# Patient Record
Sex: Female | Born: 1970 | Hispanic: No | Smoking: Current some day smoker
Health system: Southern US, Community
[De-identification: ages and names within clinical notes are randomized; demographics above are authoritative.]

## PROBLEM LIST (undated history)

## (undated) DIAGNOSIS — I10 Essential (primary) hypertension: Secondary | ICD-10-CM

## (undated) DIAGNOSIS — E119 Type 2 diabetes mellitus without complications: Secondary | ICD-10-CM

## (undated) DIAGNOSIS — E7800 Pure hypercholesterolemia, unspecified: Secondary | ICD-10-CM

## (undated) DIAGNOSIS — F329 Major depressive disorder, single episode, unspecified: Secondary | ICD-10-CM

## (undated) DIAGNOSIS — F32A Depression, unspecified: Secondary | ICD-10-CM

## (undated) DIAGNOSIS — F419 Anxiety disorder, unspecified: Secondary | ICD-10-CM

## (undated) HISTORY — PX: ABDOMINAL HYSTERECTOMY: SHX81

---

## 2013-04-12 ENCOUNTER — Ambulatory Visit (INDEPENDENT_AMBULATORY_CARE_PROVIDER_SITE_OTHER): Payer: Self-pay | Admitting: Nurse Practitioner

## 2013-04-12 ENCOUNTER — Other Ambulatory Visit: Payer: Self-pay | Admitting: *Deleted

## 2013-04-12 ENCOUNTER — Encounter: Payer: Self-pay | Admitting: Nurse Practitioner

## 2013-04-12 VITALS — BP 154/92 | HR 66 | Temp 97.7°F

## 2013-04-12 DIAGNOSIS — R4789 Other speech disturbances: Secondary | ICD-10-CM

## 2013-04-12 DIAGNOSIS — R479 Unspecified speech disturbances: Secondary | ICD-10-CM

## 2013-04-12 DIAGNOSIS — R079 Chest pain, unspecified: Secondary | ICD-10-CM

## 2013-04-12 NOTE — Progress Notes (Addendum)
  Subjective:    Patient ID: Samantha Shelton, female    DOB: [birth year], 42 y.o.   MRN: [id number]  HPI This pateint was brought in by mom having never been seen here- They were on their way home this morning when patient started having difficulty speaking an dc/o SOB and chesyt pain- By the time she arrived here she was no longer having chest pain but speech very slurred- Haong trouble getting thoughts to come out verbally- Denies any weakness on either side- She is a diabetic but was unable to give [place] a list of meds and mom did not know what she was taking either    Review of Systems  Constitutional: Negative for fever, chills, appetite change and fatigue.  HENT: Negative.   Respiratory: Positive for chest tightness and shortness of breath. Negative for cough and choking.   Cardiovascular: Positive for chest pain. Negative for palpitations and leg swelling.  Gastrointestinal: Negative.   Genitourinary: Negative.   Musculoskeletal: Negative.   Neurological: Positive for speech difficulty. Negative for dizziness, tremors, syncope, facial asymmetry, weakness, light-headedness, numbness and headaches.       Objective:   Physical Exam  Constitutional: She is oriented to person, place, and time. She appears well-developed and well-nourished.  Eyes: Conjunctivae and EOM are normal. Pupils are equal, round, and reactive to light.  Neck: Normal range of motion. Neck supple.  Cardiovascular: Normal rate, regular rhythm and normal heart sounds.   Pulmonary/Chest: Effort normal and breath sounds normal.  Musculoskeletal: Normal range of motion.  No weakness noted in any extremities  Neurological: She is alert and oriented to person, place, and time. She has normal reflexes. No cranial nerve deficit.  Psychiatric: She has a normal mood and affect. Her behavior is normal. Judgment and thought content normal.  Speech very slurred- trouble getting words to come out.   Blood sugar 124 IV NACL- KVO-  Left antecubital 20g O2 via cannulla  EKG- NSR     Assessment & Plan:   1. Speech abnormality   2. Chest pain    [facility]- [facility]- (EMS argued with patient as to where to go- [facility] wanted to go to [place] and they wanted to take her to [facility])  Report given to EMS Mary-Margaret Daphine Deutscher, FNP

## 2013-04-12 NOTE — Addendum Note (Signed)
Addended by: Monica Becton on: 04/12/2013 05:28 PM   Modules accepted: Orders

## 2013-05-06 ENCOUNTER — Encounter (HOSPITAL_COMMUNITY): Payer: Self-pay | Admitting: Emergency Medicine

## 2013-05-06 ENCOUNTER — Emergency Department (HOSPITAL_COMMUNITY)
Admission: EM | Admit: 2013-05-06 | Discharge: 2013-05-07 | Disposition: A | Discharge status: Home / Self Care | Payer: No Typology Code available for payment source | Attending: Emergency Medicine | Admitting: Emergency Medicine

## 2013-05-06 DIAGNOSIS — S0990XA Unspecified injury of head, initial encounter: Secondary | ICD-10-CM | POA: Insufficient documentation

## 2013-05-06 DIAGNOSIS — Y92410 Unspecified street and highway as the place of occurrence of the external cause: Secondary | ICD-10-CM | POA: Insufficient documentation

## 2013-05-06 DIAGNOSIS — S298XXA Other specified injuries of thorax, initial encounter: Secondary | ICD-10-CM | POA: Insufficient documentation

## 2013-05-06 DIAGNOSIS — IMO0002 Reserved for concepts with insufficient information to code with codable children: Secondary | ICD-10-CM | POA: Insufficient documentation

## 2013-05-06 DIAGNOSIS — E119 Type 2 diabetes mellitus without complications: Secondary | ICD-10-CM

## 2013-05-06 DIAGNOSIS — M25551 Pain in right hip: Secondary | ICD-10-CM

## 2013-05-06 DIAGNOSIS — M79601 Pain in right arm: Secondary | ICD-10-CM

## 2013-05-06 DIAGNOSIS — S79919A Unspecified injury of unspecified hip, initial encounter: Secondary | ICD-10-CM | POA: Insufficient documentation

## 2013-05-06 DIAGNOSIS — S46909A Unspecified injury of unspecified muscle, fascia and tendon at shoulder and upper arm level, unspecified arm, initial encounter: Secondary | ICD-10-CM | POA: Insufficient documentation

## 2013-05-06 DIAGNOSIS — R079 Chest pain, unspecified: Secondary | ICD-10-CM

## 2013-05-06 DIAGNOSIS — Z794 Long term (current) use of insulin: Secondary | ICD-10-CM | POA: Insufficient documentation

## 2013-05-06 DIAGNOSIS — Z79899 Other long term (current) drug therapy: Secondary | ICD-10-CM | POA: Insufficient documentation

## 2013-05-06 DIAGNOSIS — E1169 Type 2 diabetes mellitus with other specified complication: Secondary | ICD-10-CM | POA: Insufficient documentation

## 2013-05-06 DIAGNOSIS — S4980XA Other specified injuries of shoulder and upper arm, unspecified arm, initial encounter: Secondary | ICD-10-CM | POA: Insufficient documentation

## 2013-05-06 DIAGNOSIS — S3981XA Other specified injuries of abdomen, initial encounter: Secondary | ICD-10-CM | POA: Insufficient documentation

## 2013-05-06 DIAGNOSIS — M542 Cervicalgia: Secondary | ICD-10-CM

## 2013-05-06 DIAGNOSIS — E162 Hypoglycemia, unspecified: Secondary | ICD-10-CM

## 2013-05-06 DIAGNOSIS — Y9389 Activity, other specified: Secondary | ICD-10-CM | POA: Insufficient documentation

## 2013-05-06 LAB — GLUCOSE, CAPILLARY: Glucose-Capillary: 66 mg/dL — ABNORMAL LOW (ref 70–99)

## 2013-05-06 MED ORDER — SODIUM CHLORIDE 0.9 % IV BOLUS (SEPSIS)
1000.0000 mL | Freq: Once | INTRAVENOUS | Status: AC
  Administered 2013-05-07: 1000 mL via INTRAVENOUS

## 2013-05-06 MED ORDER — KETOROLAC TROMETHAMINE 30 MG/ML IJ SOLN
30.0000 mg | Freq: Once | INTRAMUSCULAR | Status: AC
  Administered 2013-05-07: 30 mg via INTRAVENOUS
  Filled 2013-05-06: qty 1
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
## 2013-05-06 NOTE — ED Notes (Signed)
CBG checked 66

## 2013-05-07 ENCOUNTER — Emergency Department (HOSPITAL_COMMUNITY): Payer: No Typology Code available for payment source

## 2013-05-07 ENCOUNTER — Encounter (HOSPITAL_COMMUNITY): Payer: Self-pay | Admitting: Radiology

## 2013-05-07 LAB — CBC
Hemoglobin: 13.4 g/dL (ref 12.0–15.0)
MCH: 30.6 pg (ref 26.0–34.0)
Platelets: 271 10*3/uL (ref 150–400)
RBC: 4.38 MIL/uL (ref 3.87–5.11)

## 2013-05-07 LAB — BASIC METABOLIC PANEL
Calcium: 9.4 mg/dL (ref 8.4–10.5)
Chloride: 101 mEq/L (ref 96–112)
GFR calc Af Amer: >90 mL/min (ref >90)
GFR calc non Af Amer: >90 mL/min (ref >90)
Glucose, Bld: 70 mg/dL (ref 70–99)
Sodium: 139 mEq/L (ref 135–145)

## 2013-05-07 LAB — GLUCOSE, CAPILLARY: Glucose-Capillary: 195 mg/dL — ABNORMAL HIGH (ref 70–99)

## 2013-05-07 MED ORDER — CYCLOBENZAPRINE HCL 10 MG PO TABS: 10.0000 mg | ORAL_TABLET | Freq: Two times a day (BID) | ORAL | Status: AC | PRN

## 2013-05-07 MED ORDER — IOHEXOL 300 MG/ML  SOLN
80.0000 mL | Freq: Once | INTRAMUSCULAR | Status: AC | PRN
  Administered 2013-05-07: 80 mL via INTRAVENOUS

## 2013-05-07 MED ORDER — DEXTROSE 50 % IV SOLN
50.0000 mL | INTRAVENOUS | Status: AC
  Administered 2013-05-07: 50 mL via INTRAVENOUS
  Filled 2013-05-07: qty 50

## 2013-05-07 MED ORDER — IBUPROFEN 600 MG PO TABS: 600.0000 mg | ORAL_TABLET | Freq: Four times a day (QID) | ORAL | Status: AC | PRN

## 2013-05-07 MED ORDER — TRAMADOL HCL 50 MG PO TABS: 50.0000 mg | ORAL_TABLET | Freq: Four times a day (QID) | ORAL | Status: AC | PRN
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
## 2013-05-07 NOTE — ED Notes (Signed)
Patient transported to CT 

## 2013-05-07 NOTE — ED Notes (Addendum)
Pt given 1/4 cup of water and green swab to relieve dry mouth. Per RN request
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
## 2016-05-23 ENCOUNTER — Encounter (HOSPITAL_COMMUNITY): Payer: Self-pay | Admitting: *Deleted

## 2016-05-23 ENCOUNTER — Emergency Department (HOSPITAL_COMMUNITY)
Admission: EM | Admit: 2016-05-23 | Discharge: 2016-05-23 | Disposition: A | Discharge status: Home / Self Care | Payer: Medicaid Other | Attending: Emergency Medicine | Admitting: Emergency Medicine

## 2016-05-23 ENCOUNTER — Emergency Department (HOSPITAL_COMMUNITY): Payer: Medicaid Other

## 2016-05-23 DIAGNOSIS — Z794 Long term (current) use of insulin: Secondary | ICD-10-CM | POA: Insufficient documentation

## 2016-05-23 DIAGNOSIS — R002 Palpitations: Secondary | ICD-10-CM | POA: Insufficient documentation

## 2016-05-23 DIAGNOSIS — E119 Type 2 diabetes mellitus without complications: Secondary | ICD-10-CM | POA: Diagnosis not present

## 2016-05-23 DIAGNOSIS — Z79899 Other long term (current) drug therapy: Secondary | ICD-10-CM | POA: Diagnosis not present

## 2016-05-23 DIAGNOSIS — I10 Essential (primary) hypertension: Secondary | ICD-10-CM | POA: Diagnosis not present

## 2016-05-23 DIAGNOSIS — F17200 Nicotine dependence, unspecified, uncomplicated: Secondary | ICD-10-CM | POA: Diagnosis not present

## 2016-05-23 DIAGNOSIS — Z91040 Latex allergy status: Secondary | ICD-10-CM | POA: Insufficient documentation

## 2016-05-23 HISTORY — DX: Anxiety disorder, unspecified: F41.9

## 2016-05-23 HISTORY — DX: Depression, unspecified: F32.A

## 2016-05-23 HISTORY — DX: Pure hypercholesterolemia, unspecified: E78.00

## 2016-05-23 HISTORY — DX: Major depressive disorder, single episode, unspecified: F32.9

## 2016-05-23 HISTORY — DX: Type 2 diabetes mellitus without complications: E11.9

## 2016-05-23 HISTORY — DX: Essential (primary) hypertension: I10

## 2016-05-23 LAB — BASIC METABOLIC PANEL
Anion gap: 12 (ref 5–15)
BUN: 9 mg/dL (ref 6–20)
CO2: 25 mmol/L (ref 22–32)
Calcium: 10.1 mg/dL (ref 8.9–10.3)
Chloride: 99 mmol/L — ABNORMAL LOW (ref 101–111)
Creatinine, Ser: 0.83 mg/dL (ref 0.44–1.00)
GFR calc Af Amer: >60 mL/min (ref >60)
GFR calc non Af Amer: >60 mL/min (ref >60)
Glucose, Bld: 274 mg/dL — ABNORMAL HIGH (ref 65–99)
Potassium: 3.4 mmol/L — ABNORMAL LOW (ref 3.5–5.1)
Sodium: 136 mmol/L (ref 135–145)

## 2016-05-23 LAB — CBC
HEMATOCRIT: 41.4 % (ref 36.0–46.0)
HEMOGLOBIN: 14.2 g/dL (ref 12.0–15.0)
MCH: 31.6 pg (ref 26.0–34.0)
MCHC: 34.3 g/dL (ref 30.0–36.0)
MCV: 92.2 fL (ref 78.0–100.0)
Platelets: 223 10*3/uL (ref 150–400)
RBC: 4.49 MIL/uL (ref 3.87–5.11)
RDW: 12.9 % (ref 11.5–15.5)
WBC: 4.8 10*3/uL (ref 4.0–10.5)

## 2016-05-23 LAB — I-STAT TROPONIN, ED: Troponin i, poc: 0 ng/mL (ref 0.00–0.08)

## 2016-05-23 MED ORDER — CLONIDINE HCL 0.1 MG PO TABS
0.1000 mg | ORAL_TABLET | Freq: Once | ORAL | Status: AC
  Administered 2016-05-23: 0.1 mg via ORAL
  Filled 2016-05-23: qty 1

## 2016-05-23 NOTE — ED Triage Notes (Signed)
EMS gave her 4 Baby ASA and one Nitro, and she felt better. Pt. Has a 20 g in left ac, .

## 2016-05-23 NOTE — ED Notes (Signed)
Pt reports having sharp headache on posterior head that began 5 mins ago; pt denies vision changes, nausea, or photophobia

## 2016-05-23 NOTE — ED Notes (Signed)
Pt was walking in the waiting room. Pt was about to change into gown without difficulty. Pt has no pain right now. Pt states that she did have an episode in the lobby about 30 mins ago where she felt her heart racing again.  Pt states she started a new medication yesterday (believed to be Lasix). Pt only ate two boiled eggs and bacon for breakfast and yogurt for lunch around 1330.
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
## 2016-05-23 NOTE — ED Provider Notes (Signed)
[facility]-EMERGENCY DEPT Provider Note   CSN: [id number] Arrival date & time: 05/23/16  1545     History   Chief Complaint Chief Complaint  Patient presents with  . Palpitations    HPI Richmond CampbellKimberly F Rocchio is a 45 y.o. female.  The history is provided by the patient.  Palpitations   This is a recurrent problem. The current episode started 1 to 2 hours ago. The problem occurs constantly. The problem has been resolved. The problem is associated with fear and anxiety (nosebleed that made her feel lightheaded). Pertinent negatives include no diaphoresis, no fever, no syncope, no abdominal pain, no cough and no shortness of breath. She has tried nothing for the symptoms.    Past Medical History:  Diagnosis Date  . Anxiety   . Depression   . Diabetes mellitus without complication (HCC)   . Hypercholesterolemia   . Hypertension     There are no active problems to display for this patient.   Past Surgical History:  Procedure Laterality Date  . ABDOMINAL HYSTERECTOMY      OB History    No data available       Home Medications    Prior to Admission medications   Medication Sig Start Date End Date Taking? Authorizing Provider  cyclobenzaprine (FLEXERIL) 10 MG tablet Take 1 tablet (10 mg total) by mouth 2 (two) times daily as needed for muscle spasms. 05/07/13   Brandt LoosenJulie Manly, MD  gabapentin (NEURONTIN) 300 MG capsule Take 300 mg by mouth 2 (two) times daily.    Historical Provider, MD  ibuprofen (ADVIL,MOTRIN) 600 MG tablet Take 1 tablet (600 mg total) by mouth every 6 (six) hours as needed for pain. 05/07/13   Brandt LoosenJulie Manly, MD  insulin aspart (NOVOLOG) 100 UNIT/ML injection Inject 4 Units into the skin 3 (three) times daily with meals.    Historical Provider, MD  insulin glargine (LANTUS) 100 UNIT/ML injection Inject into the skin at bedtime. 20-30 u daily    Historical Provider, MD  lisinopril (PRINIVIL,ZESTRIL) 10 MG tablet Take 10 mg by mouth daily.    Historical Provider, MD    traMADol (ULTRAM) 50 MG tablet Take 1 tablet (50 mg total) by mouth every 6 (six) hours as needed for pain. 05/07/13   Brandt LoosenJulie Manly, MD    Family History No family history on file.  Social History Social History  Substance Use Topics  . Smoking status: Current Some Day Smoker  . Smokeless tobacco: Never Used  . Alcohol use Yes     Allergies   Celexa [citalopram] and Latex   Review of Systems Review of Systems  Constitutional: Negative for diaphoresis and fever.  Respiratory: Negative for cough and shortness of breath.   Cardiovascular: Positive for palpitations. Negative for syncope.  Gastrointestinal: Negative for abdominal pain.  All other systems reviewed and are negative.    Physical Exam Updated Vital Signs BP 175/86   Pulse (!) 58   Temp 98.4 F (36.9 C) (Oral)   Resp 16   Wt 148 lb (67.1 kg)   SpO2 100%   Physical Exam  Constitutional: She is oriented to person, place, and time. She appears well-developed and well-nourished. No distress.  HENT:  Head: Normocephalic.  Nose: Nose normal.  Eyes: Conjunctivae are normal.  Neck: Neck supple. No tracheal deviation present.  Cardiovascular: Normal rate, regular rhythm and normal heart sounds.  Exam reveals no gallop and no friction rub.   No murmur heard. Pulmonary/Chest: Effort normal and breath sounds normal. No  respiratory distress. She has no wheezes. She has no rales.  Abdominal: Soft. She exhibits no distension. There is no tenderness.  Neurological: She is alert and oriented to person, place, and time.  Skin: Skin is warm and dry. Capillary refill takes less than 2 seconds.  Psychiatric: She has a normal mood and affect.     ED Treatments / Results  Labs (all labs ordered are listed, but only abnormal results are displayed) Labs Reviewed  BASIC METABOLIC PANEL - Abnormal; Notable for the following:       Result Value   Potassium 3.4 (*)    Chloride 99 (*)    Glucose, Bld 274 (*)    All other  components within normal limits  CBC  I-STAT TROPOININ, ED    EKG  EKG Interpretation  Date/Time:  Saturday May 23 2016 15:48:55 EDT Ventricular Rate:  62 PR Interval:  136 QRS Duration: 104 QT Interval:  424 QTC Calculation: 430 R Axis:   10 Text Interpretation:  Normal sinus rhythm Benign early repolarization No significant change since last tracing Confirmed by Nishita Isaacks MD, Raeanne Deschler [phone]) on 05/23/2016 6:43:35 PM       Radiology Dg Chest 2 View  Result Date: 05/23/2016 CLINICAL DATA:  Chest pain and palpitations.  Tachycardia. EXAM: CHEST  2 VIEW COMPARISON:  05/07/2013 FINDINGS: The heart size and mediastinal contours are within normal limits. Both lungs are clear. The visualized skeletal structures are unremarkable. IMPRESSION: Normal exam. Electronically Signed   By: Francene BoyersJames  Maxwell M.D.   On: 05/23/2016 17:28    Procedures Procedures (including critical care time)  Medications Ordered in ED Medications  cloNIDine (CATAPRES) tablet 0.1 mg (0.1 mg Oral Given 05/23/16 1936)     Initial Impression / Assessment and Plan / ED Course  I have reviewed the triage vital signs and the nursing notes.  Pertinent labs & imaging results that were available during my care of the patient were reviewed by me and considered in my medical decision making (see chart for details).  Clinical Course    45 y.o. female presents with having nosebleed and feeling briefly lightheaded with dizziness. Recently started on HCTZ. She is concerned having palpitations and anxiety. EKG interpreted by me without ST or T wave changes concerning for myocardial ischemia. No delta wave, no prolonged QTc, no brugada to suggest arrhythmogenicity. Provided clonidine to help with anxiety and BP but I explained this is an outpatient issue that would need follow up and titration of medicines. Plan to follow up with PCP as needed and return precautions discussed for worsening or new concerning symptoms.   Final  Clinical Impressions(s) / ED Diagnoses   Final diagnoses:  Palpitations    New Prescriptions New Prescriptions   No medications on file     Lyndal Pulleyaniel Raider Valbuena, MD 05/24/16 0028

## 2016-05-23 NOTE — ED Triage Notes (Signed)
Patient states she was started on a new medication for her bp on yesterday  She thinks it was hydrochlorothiazide.  Patient states she blew her nose had a nose bleed and then had onset of feeling dizziness.  Patient also diabetic.  cbg was elevated today.   She is alert.

## 2018-05-01 IMAGING — CR DG CHEST 2V
2 series · 2 of 2 positions shown · non-contrast
Comparison: 05/07/2013

CLINICAL DATA: Chest pain and palpitations.  Tachycardia.

EXAM:
CHEST  2 VIEW

[chest lat]
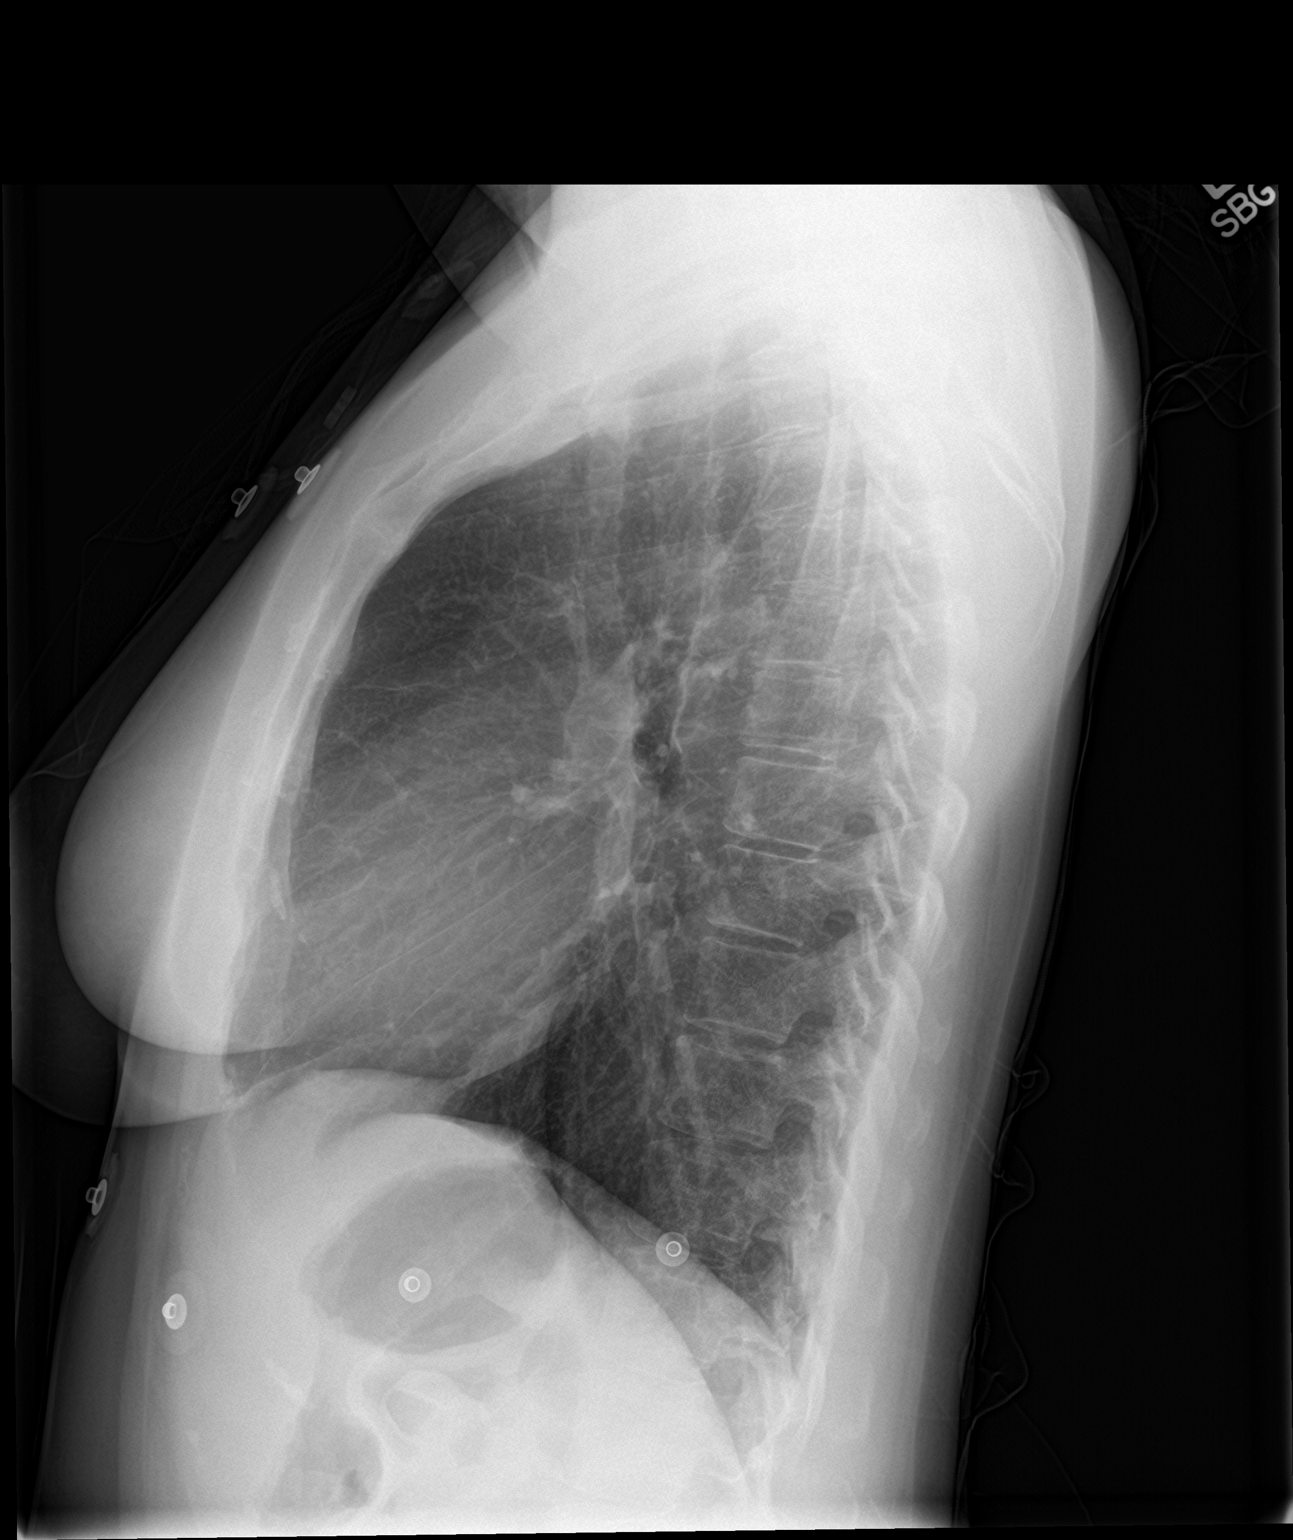

[chest pa]
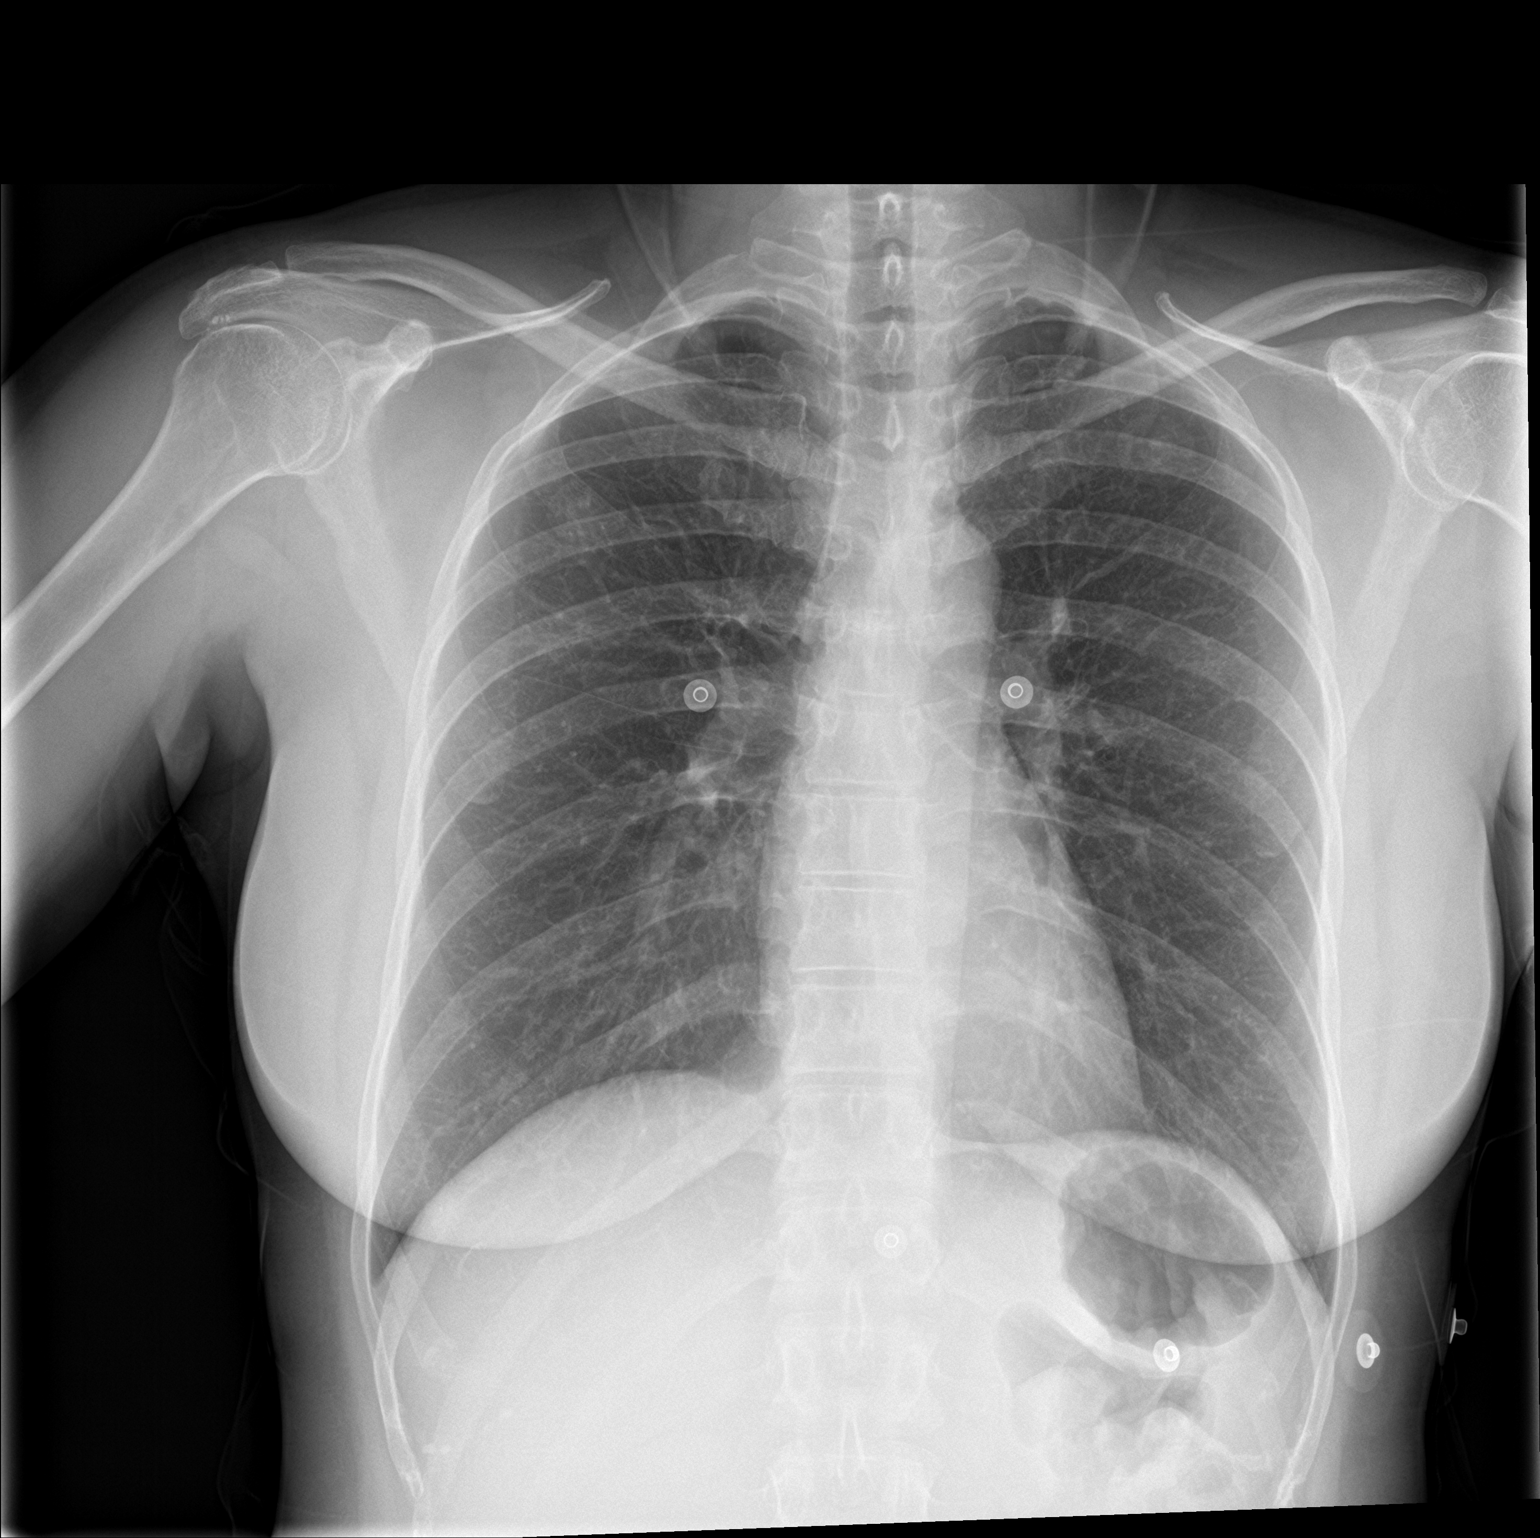

[2 of 2 positions shown; findings below may reference images not displayed]

FINDINGS: The heart size and mediastinal contours are within normal limits.
Both lungs are clear. The visualized skeletal structures are
unremarkable.
IMPRESSION: Normal exam.

## 2022-04-12 ENCOUNTER — Encounter (HOSPITAL_COMMUNITY): Payer: Self-pay | Admitting: Emergency Medicine

## 2022-04-12 ENCOUNTER — Emergency Department (HOSPITAL_COMMUNITY)
Admission: EM | Admit: 2022-04-12 | Discharge: 2022-04-12 | Discharge status: Against Medical Advice | Payer: Self-pay | Attending: Emergency Medicine | Admitting: Emergency Medicine

## 2022-04-12 DIAGNOSIS — M79641 Pain in right hand: Secondary | ICD-10-CM | POA: Insufficient documentation

## 2022-04-12 DIAGNOSIS — M79642 Pain in left hand: Secondary | ICD-10-CM | POA: Insufficient documentation

## 2022-04-12 DIAGNOSIS — Z5321 Procedure and treatment not carried out due to patient leaving prior to being seen by health care provider: Secondary | ICD-10-CM | POA: Insufficient documentation

## 2022-04-12 NOTE — ED Triage Notes (Signed)
Pt c/o bilateral hand cramping that began about 2 hrs PTA. Pt denies hx of same. Pt asking for pain medication, when I explained to pt that I couldn't give her anything until the MD say pt. She stated she had something in her purse she could take, explained to pt not to take any medications without a MD order.

## 2022-04-12 NOTE — ED Provider Notes (Signed)
This patient left before I could evaluate her   Ripley Fraise, MD 04/12/22 [phone]

## 2022-04-12 NOTE — ED Notes (Signed)
Pt admits to having a few drinks tonight with dinner as well.
